# Patient Record
Sex: Female | Born: 1950 | Race: White | Hispanic: No | Smoking: Former smoker
Health system: Southern US, Community
[De-identification: ages and names within clinical notes are randomized; demographics above are authoritative.]

## PROBLEM LIST (undated history)

## (undated) DIAGNOSIS — F32A Depression, unspecified: Secondary | ICD-10-CM

## (undated) DIAGNOSIS — N6009 Solitary cyst of unspecified breast: Secondary | ICD-10-CM

## (undated) DIAGNOSIS — M1990 Unspecified osteoarthritis, unspecified site: Secondary | ICD-10-CM

## (undated) DIAGNOSIS — C541 Malignant neoplasm of endometrium: Secondary | ICD-10-CM

## (undated) DIAGNOSIS — F329 Major depressive disorder, single episode, unspecified: Secondary | ICD-10-CM

## (undated) HISTORY — DX: Solitary cyst of unspecified breast: N60.09

## (undated) HISTORY — DX: Unspecified osteoarthritis, unspecified site: M19.90

## (undated) HISTORY — DX: Malignant neoplasm of endometrium: C54.1

## (undated) HISTORY — DX: Depression, unspecified: F32.A

## (undated) HISTORY — DX: Major depressive disorder, single episode, unspecified: F32.9

---

## 1992-07-06 HISTORY — PX: TOTAL VAGINAL HYSTERECTOMY: SHX2548

## 2001-08-04 ENCOUNTER — Other Ambulatory Visit: Admission: RE | Admit: 2001-08-04 | Discharge: 2001-08-04 | Payer: Self-pay | Admitting: Radiology

## 2006-05-11 ENCOUNTER — Ambulatory Visit: Payer: Self-pay | Admitting: Family Medicine

## 2006-05-12 ENCOUNTER — Encounter: Admission: RE | Admit: 2006-05-12 | Discharge: 2006-05-12 | Payer: Self-pay | Admitting: Family Medicine

## 2006-05-17 ENCOUNTER — Ambulatory Visit: Payer: Self-pay | Admitting: Family Medicine

## 2006-05-17 LAB — CONVERTED CEMR LAB
ALT: 14 units/L (ref 0–40)
Albumin: 3.7 g/dL (ref 3.5–5.2)
Alkaline Phosphatase: 38 units/L — ABNORMAL LOW (ref 39–117)
BUN: 13 mg/dL (ref 6–23)
Basophils Absolute: 0 10*3/uL (ref 0.0–0.1)
Basophils Relative: 0.2 % (ref 0.0–1.0)
CO2: 29 meq/L (ref 19–32)
Calcium: 9 mg/dL (ref 8.4–10.5)
Chloride: 105 meq/L (ref 96–112)
GFR calc non Af Amer: 62 mL/min
Glomerular Filtration Rate, Af Am: 75 mL/min/{1.73_m2}
Glucose, Bld: 96 mg/dL (ref 70–99)
HCT: 41 % (ref 36.0–46.0)
Lymphocytes Relative: 48.6 % — ABNORMAL HIGH (ref 12.0–46.0)
MCV: 86.1 fL (ref 78.0–100.0)
Neutro Abs: 2.9 10*3/uL (ref 1.4–7.7)
Platelets: 298 10*3/uL (ref 150–400)
RBC: 4.76 M/uL (ref 3.87–5.11)
RDW: 12.2 % (ref 11.5–14.6)
Sed Rate: 11 mm/hr (ref 0–25)
T3, Free: 2.9 pg/mL (ref 2.3–4.2)
Total Bilirubin: 0.6 mg/dL (ref 0.3–1.2)
Vitamin B-12: 266 pg/mL (ref 211–911)

## 2006-05-21 ENCOUNTER — Ambulatory Visit: Payer: Self-pay

## 2006-06-04 ENCOUNTER — Ambulatory Visit: Payer: Self-pay | Admitting: Internal Medicine

## 2006-06-18 ENCOUNTER — Ambulatory Visit: Payer: Self-pay | Admitting: Family Medicine

## 2006-06-18 LAB — CONVERTED CEMR LAB
Chol/HDL Ratio, serum: 2.3
Cholesterol: 168 mg/dL
HDL: 71.5 mg/dL
LDL Cholesterol: 75 mg/dL
Triglyceride fasting, serum: 109 mg/dL
VLDL: 22 mg/dL

## 2007-02-04 ENCOUNTER — Encounter: Payer: Self-pay | Admitting: Family Medicine

## 2007-02-15 ENCOUNTER — Encounter: Payer: Self-pay | Admitting: Family Medicine

## 2007-03-29 ENCOUNTER — Ambulatory Visit: Payer: Self-pay | Admitting: Family Medicine

## 2007-03-29 DIAGNOSIS — F329 Major depressive disorder, single episode, unspecified: Secondary | ICD-10-CM

## 2007-03-29 DIAGNOSIS — J069 Acute upper respiratory infection, unspecified: Secondary | ICD-10-CM | POA: Insufficient documentation

## 2007-05-23 ENCOUNTER — Ambulatory Visit: Payer: Self-pay | Admitting: Family Medicine

## 2008-01-03 ENCOUNTER — Ambulatory Visit: Payer: Self-pay | Admitting: Family Medicine

## 2008-03-13 ENCOUNTER — Encounter: Payer: Self-pay | Admitting: Family Medicine

## 2008-03-20 ENCOUNTER — Ambulatory Visit: Payer: Self-pay | Admitting: Family Medicine

## 2008-03-20 DIAGNOSIS — N6009 Solitary cyst of unspecified breast: Secondary | ICD-10-CM

## 2008-03-22 ENCOUNTER — Encounter (INDEPENDENT_AMBULATORY_CARE_PROVIDER_SITE_OTHER): Payer: Self-pay | Admitting: *Deleted

## 2008-03-28 ENCOUNTER — Encounter (INDEPENDENT_AMBULATORY_CARE_PROVIDER_SITE_OTHER): Payer: Self-pay | Admitting: *Deleted

## 2008-03-28 ENCOUNTER — Ambulatory Visit: Payer: Self-pay | Admitting: Family Medicine

## 2008-03-28 LAB — CONVERTED CEMR LAB: OCCULT 3: NEGATIVE

## 2009-03-06 ENCOUNTER — Encounter: Payer: Self-pay | Admitting: Family Medicine

## 2009-03-19 ENCOUNTER — Encounter: Payer: Self-pay | Admitting: Family Medicine

## 2009-08-22 ENCOUNTER — Telehealth: Payer: Self-pay | Admitting: Family Medicine

## 2009-08-23 ENCOUNTER — Ambulatory Visit: Payer: Self-pay | Admitting: Family Medicine

## 2010-01-14 ENCOUNTER — Ambulatory Visit: Payer: Self-pay | Admitting: Family Medicine

## 2010-01-14 DIAGNOSIS — D239 Other benign neoplasm of skin, unspecified: Secondary | ICD-10-CM | POA: Insufficient documentation

## 2010-01-14 DIAGNOSIS — J301 Allergic rhinitis due to pollen: Secondary | ICD-10-CM | POA: Insufficient documentation

## 2010-01-27 ENCOUNTER — Encounter: Payer: Self-pay | Admitting: Family Medicine

## 2010-01-31 ENCOUNTER — Telehealth: Payer: Self-pay | Admitting: Family Medicine

## 2010-02-26 ENCOUNTER — Encounter: Payer: Self-pay | Admitting: Family Medicine

## 2010-04-02 ENCOUNTER — Encounter: Payer: Self-pay | Admitting: Family Medicine

## 2010-08-03 LAB — CONVERTED CEMR LAB
AST: 19 units/L (ref 0–37)
Albumin: 4.3 g/dL (ref 3.5–5.2)
Alkaline Phosphatase: 44 units/L (ref 39–117)
Basophils Relative: 0.4 % (ref 0.0–3.0)
Bilirubin Urine: NEGATIVE
CO2: 27 meq/L (ref 19–32)
Calcium: 9.4 mg/dL (ref 8.4–10.5)
Cholesterol: 175 mg/dL (ref 0–200)
Creatinine, Ser: 0.8 mg/dL (ref 0.4–1.2)
Eosinophils Relative: 0.8 % (ref 0.0–5.0)
GFR calc non Af Amer: 79 mL/min
MCHC: 33.8 g/dL (ref 30.0–36.0)
MCV: 89.9 fL (ref 78.0–100.0)
Monocytes Relative: 7.7 % (ref 3.0–12.0)
Neutro Abs: 3.4 10*3/uL (ref 1.4–7.7)
Neutrophils Relative %: 47.4 % (ref 43.0–77.0)
Nitrite: NEGATIVE
Platelets: 286 10*3/uL (ref 150–400)
Protein, U semiquant: NEGATIVE
RDW: 12.4 % (ref 11.5–14.6)
Sodium: 141 meq/L (ref 135–145)
Total Bilirubin: 0.7 mg/dL (ref 0.3–1.2)
Total CHOL/HDL Ratio: 1.7
Total Protein: 7.8 g/dL (ref 6.0–8.3)
Triglycerides: 138 mg/dL (ref 0–149)
WBC Urine, dipstick: NEGATIVE

## 2010-08-05 NOTE — Progress Notes (Signed)
Summary: Wants Tamiflu  Phone Note Call from Patient Call back at Home Phone [phone]   Caller: Patient Summary of Call: Patient is coming in tomorrow for possible flu. She wanted to know if you could send her some tamiflu bc she knows there is a 48 hour window from being around it and stopping it. If you can call her and let her know what you decide this afternoon. She uses [facility] in [place]. Thanks! Initial call taken by: Harold Barban,  August 22, 2009 3:54 PM  Follow-up for Phone Call        we need her symptoms and how long etc? Follow-up by: Loreen Freud DO,  August 22, 2009 4:45 PM  Additional Follow-up for Phone Call Additional follow up Details #1::        Pt states that her symptoms are- low grade fever of 99.0 , stiff in neck and shoulders, dry cough. Has tried theraflu, and tylenol, started last night. Has been nursing sick people in her house for over a week.     Additional Follow-up for Phone Call Additional follow up Details #2::    per dr Laury Axon okay to call in tamiflu. Army Fossa CMA  August 22, 2009 4:53 PM   New/Updated Medications: TAMIFLU 75 MG CAPS (OSELTAMIVIR PHOSPHATE) 1 by mouth two times a day for 5 days. Prescriptions: TAMIFLU 75 MG CAPS (OSELTAMIVIR PHOSPHATE) 1 by mouth two times a day for 5 days.  #10 x 0   Entered by:   Army Fossa CMA   Authorized by:   Loreen Freud DO   Signed by:   Army Fossa CMA on 08/22/2009   Method used:   Electronically to        [facility] [address] [id number] (retail)       [address] [place], [place]  [address]       Ph: [phone] or [phone]       Fax: [phone]   RxID:   [id number]

## 2010-08-05 NOTE — Assessment & Plan Note (Signed)
Summary: possible flu//lch   Vital Signs:  Patient profile:   60 year old female Height:      67 inches Weight:      146 pounds BMI:     22.95 Temp:     98.2 degrees F oral Pulse rate:   76 / minute Pulse rhythm:   regular BP sitting:   126 / 82  (left arm) Cuff size:   regular  Vitals Entered By: Army Fossa CMA (August 23, 2009 1:58 PM) CC: Pt c/o low grade fever, exposed to flu?, losing voice, cough. , URI symptoms   History of Present Illness:       This is a 60 year old woman who presents with URI symptoms.  The symptoms began 2 weeks ago.  The patient complains of sore throat, dry cough, and sick contacts, but denies nasal congestion, clear nasal discharge, purulent nasal discharge, productive cough, and earache.  The patient denies fever, low-grade fever (<100.5 degrees), fever of 100.5-103 degrees, fever of 103.1-104 degrees, fever to >104 degrees, stiff neck, dyspnea, wheezing, rash, vomiting, diarrhea, use of an antipyretic, and response to antipyretic.  The patient also reports itchy throat.  The patient denies itchy watery eyes, sneezing, seasonal symptoms, response to antihistamine, headache, muscle aches, and severe fatigue.  The patient denies the following risk factors for Strep sinusitis: unilateral facial pain, unilateral nasal discharge, poor response to decongestant, double sickening, tooth pain, Strep exposure, tender adenopathy, and absence of cough.    Current Medications (verified): 1)  Premarin 0.9 Mg  Tabs (Estrogens Conjugated) .Marland Kitchen.. 1 By Mouth Once Daily 2)  Tamiflu 75 Mg Caps (Oseltamivir Phosphate) .Marland Kitchen.. 1 By Mouth Two Times A Day For 5 Days.  Allergies: 1)  ! Pcn  Past History:  Past medical, surgical, family and social histories (including risk factors) reviewed for relevance to current acute and chronic problems.  Past Medical History: Reviewed history from 03/20/2008 and no changes required. Depression Current Problems:  DEPRESSION  (ICD-311) URI (ICD-465.9)  Past Surgical History: Reviewed history from 03/20/2008 and no changes required. 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 Family History of CAD Female --- Muncle,  Mgf Family History Hypertension  Social History: Reviewed history from 03/20/2008 and no changes required. Widow/Widower Former Smoker Drug use-no Regular exercise-yes  Review of Systems      See HPI  Physical Exam  General:  Well-developed,well-nourished,in no acute distress; alert,appropriate and cooperative throughout examination Ears:  External ear exam shows no significant lesions or deformities.  Otoscopic examination reveals clear canals, tympanic membranes are intact bilaterally without bulging, retraction, inflammation or discharge. Hearing is grossly normal bilaterally. Nose:  External nasal examination shows no deformity or inflammation. Nasal mucosa are pink and moist without lesions or exudates. Mouth:  Oral mucosa and oropharynx without lesions or exudates.  Teeth in good repair. Neck:  No deformities, masses, or tenderness noted. Lungs:  Normal respiratory effort, chest expands symmetrically. Lungs are clear to auscultation, no crackles or wheezes. Heart:  Normal rate and regular rhythm. S1 and S2 normal without gallop, murmur, click, rub or other extra sounds. Psych:  Oriented X3 and normally interactive.     Impression & Recommendations:  Problem # 1:  URI (ICD-465.9)  DELSYM NASONEX CLARINEX CALL OR RTO as needed   Instructed on symptomatic treatment. Call if symptoms persist or worsen.   Orders: Flu A+B ([address]109)  Complete Medication List: 1)  Premarin 0.9 Mg Tabs (  Estrogens conjugated) .Marland Kitchen.. 1 by mouth once daily 2)  Tamiflu 75 Mg Caps (Oseltamivir phosphate) .Marland Kitchen.. 1 by mouth two times a day for 5 days.

## 2010-08-05 NOTE — Progress Notes (Signed)
Summary: Request for Epi Pen  Phone Note Call from Patient Call back at Home Phone [phone]   Caller: Patient Summary of Call: Message left on Triage VM: patient would like RX for Epi/Benadryl pen, patient use to have a rx for this but never used it so she stopped requesting it, now patient would like    Shonna Chock CMA  January 31, 2010 2:30 PM   Follow-up for Phone Call        epi pen 0.3 mg IM x1  #1   Follow-up by: Loreen Freud DO,  January 31, 2010 2:38 PM  Additional Follow-up for Phone Call Additional follow up Details #1::        Patient aware. Lucious Groves CMA  January 31, 2010 2:44 PM      New/Updated Medications: EPIPEN 0.3 MG/0.3ML DEVI (EPINEPHRINE) use as directed as needed Prescriptions: EPIPEN 0.3 MG/0.3ML DEVI (EPINEPHRINE) use as directed as needed  #1 x 1   Entered by:   Lucious Groves CMA   Authorized by:   Loreen Freud DO   Signed by:   Lucious Groves CMA on 01/31/2010   Method used:   Faxed to ...       [id number] (retail)       [address]       [place], [place]  [address]       Ph: [phone]       Fax: [phone]   RxID:   [id number]

## 2010-08-05 NOTE — Consult Note (Signed)
Summary: [facility]  [facility]   Imported By: Lanelle Bal 02/20/2010 09:53:46  _____________________________________________________________________  External Attachment:    Type:   Image     Comment:   External Document

## 2010-08-05 NOTE — Assessment & Plan Note (Signed)
Summary: SPOT ON FACE/KN   Vital Signs:  Patient profile:   60 year old female Height:      67 inches Weight:      150 pounds Temp:     98.2 degrees F oral Pulse rate:   72 / minute BP sitting:   130 / 80  (left arm)  Vitals Entered By: Jeremy Johann CMA (January 14, 2010 11:25 AM) CC: spot on face, sore throat, URI symptoms Comments REVIEWED MED LIST, PATIENT AGREED DOSE AND INSTRUCTION CORRECT    History of Present Illness:       This is a 60 year old woman who presents with URI symptoms.  The symptoms began 2 months ago.  The patient complains of sore throat, but denies nasal congestion, clear nasal discharge, purulent nasal discharge, dry cough, productive cough, earache, and sick contacts.  The patient denies fever, low-grade fever (<100.5 degrees), fever of 100.5-103 degrees, fever of 103.1-104 degrees, fever to >104 degrees, stiff neck, dyspnea, wheezing, rash, vomiting, diarrhea, use of an antipyretic, and response to antipyretic.  The patient also reports itchy throat, sneezing, and response to antihistamine.  The patient denies the following risk factors for Strep sinusitis: unilateral facial pain, unilateral nasal discharge, poor response to decongestant, double sickening, tooth pain, Strep exposure, tender adenopathy, and absence of cough.    Current Medications (verified): 1)  Premarin 0.625 Mg Tabs (Estrogens Conjugated) .... Take 1 Tab Once Daily 2)  Claritin 10 Mg Tabs (Loratadine) .Marland Kitchen.. 1 By Mouth Once Daily  Allergies: 1)  ! Pcn  Past History:  Past medical, surgical, family and social histories (including risk factors) reviewed for relevance to current acute and chronic problems.  Past Medical History: Reviewed history from 03/20/2008 and no changes required. Depression Current Problems:  DEPRESSION (ICD-311) URI (ICD-465.9)  Past Surgical History: Reviewed history from 03/20/2008 and no changes required. 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 Family History of CAD Female --- Muncle,  Mgf Family History Hypertension  Social History: Reviewed history from 03/20/2008 and no changes required. Widow/Widower Former Smoker Drug use-no Regular exercise-yes  Review of Systems      See HPI  Physical Exam  General:  Well-developed,well-nourished,in no acute distress; alert,appropriate and cooperative throughout examination Ears:  External ear exam shows no significant lesions or deformities.  Otoscopic examination reveals clear canals, tympanic membranes are intact bilaterally without bulging, retraction, inflammation or discharge. Hearing is grossly normal bilaterally. Nose:  External nasal examination shows no deformity or inflammation. Nasal mucosa are pink and moist without lesions or exudates. Mouth:  pharyngeal erythema and postnasal drip.   Lungs:  Normal respiratory effort, chest expands symmetrically. Lungs are clear to auscultation, no crackles or wheezes. Heart:  normal rate and no murmur.   Skin:  mult sk small black mole R side cheek  Psych:  Cognition and judgment appear intact. Alert and cooperative with normal attention span and concentration. No apparent delusions, illusions, hallucinations   Impression & Recommendations:  Problem # 1:  ALLERGIC RHINITIS, SEASONAL (ICD-477.0)  claritin 10 mg 1 by mouth once daily as needed  call or rto as needed   Orders: Rapid Strep (16109)  Problem # 2:  NEVI, MULTIPLE (ICD-216.9)  Orders: Dermatology Referral (Derma)  Complete Medication List: 1)  Premarin 0.625 Mg Tabs (Estrogens conjugated) .... Take 1 tab once daily 2)  Claritin 10 Mg Tabs (Loratadine) .Marland Kitchen.. 1 by mouth once daily Prescriptions: CLARITIN 10  MG TABS (LORATADINE) 1 by mouth once daily  #90 x 3   Entered and Authorized by:   Loreen Freud DO   Signed by:   Loreen Freud DO on 01/14/2010   Method used:   Print then Give  to Patient   RxID:   [id number]   Laboratory Results    Other Tests  Rapid Strep: negative

## 2010-08-27 ENCOUNTER — Encounter: Payer: Self-pay | Admitting: Internal Medicine

## 2010-08-27 ENCOUNTER — Ambulatory Visit (INDEPENDENT_AMBULATORY_CARE_PROVIDER_SITE_OTHER): Admitting: Internal Medicine

## 2010-08-27 DIAGNOSIS — N390 Urinary tract infection, site not specified: Secondary | ICD-10-CM

## 2010-08-27 LAB — CONVERTED CEMR LAB
Bilirubin Urine: NEGATIVE
Glucose, Urine, Semiquant: NEGATIVE
Protein, U semiquant: NEGATIVE
Urobilinogen, UA: 0.2

## 2010-08-28 ENCOUNTER — Encounter: Payer: Self-pay | Admitting: Internal Medicine

## 2010-08-28 LAB — CONVERTED CEMR LAB: Crystals: NONE SEEN

## 2010-09-01 ENCOUNTER — Telehealth: Payer: Self-pay | Admitting: Internal Medicine

## 2010-09-02 NOTE — Assessment & Plan Note (Signed)
Summary: blood in urine, ? kidney stone//fd   Vital Signs:  Patient profile:   60 year old female Height:      67 inches Weight:      153.25 pounds BMI:     24.09 Temp:     98.0 degrees F oral Pulse rate:   81 / minute Pulse rhythm:   regular BP sitting:   116 / 80  (left arm) Cuff size:   regular  Vitals Entered By: Army Fossa CMA (August 27, 2010 2:27 PM) CC: Possibly passed kidney stone last night  Comments seeing bright red blood, a few "clots" lots of stinging  feels fine today [facility]   History of Present Illness: had gross hematuria yesterday x 3 hours, + dysuria, frecuency and urgency saw few clots in the urine this morning urine looks normal and she feels well   ROS no fever no abd pain no flank pain no N-V  Current Medications (verified): 1)  Premarin 0.625 Mg Tabs (Estrogens Conjugated) .... Take 1 Tab Once Daily 2)  Claritin 10 Mg Tabs (Loratadine) .Marland Kitchen.. 1 By Mouth Once Daily 3)  Epipen 0.3 Mg/0.31ml Devi (Epinephrine) .... Use As Directed As Needed  Allergies (verified): 1)  ! Pcn  Past History:  Past Medical History: Depression    Past Surgical History: Reviewed history from 03/20/2008 and no changes required. 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   Abdomen:  soft, non-tender, no distention, no masses, no guarding, and no rigidity.  no CVA tenderness  Rectal:  no masses.   Extremities:  no pretibial edema bilaterally  Psych:  not anxious appearing and not depressed appearing.     Impression & Recommendations:  Problem # 1:  UTI (ICD-599.0) episode of gross hematuria w/o flank or abd. pain patient concerned about stones but likely she didn't experience a renal colic u dip +, see instructions   Her updated medication list for this problem includes:  Ciprofloxacin Hcl 500 Mg Tabs (Ciprofloxacin hcl) .Marland Kitchen... 1 by mouth two times a day x 5 days  Orders: UA Dipstick w/o Micro (automated)  (81003) T-Culture, Urine ([id number]) T-Urine Microscopic (84166-06301)  Complete Medication List: 1)  Premarin 0.625 Mg Tabs (Estrogens conjugated) .... Take 1 tab once daily 2)  Claritin 10 Mg Tabs (Loratadine) .Marland Kitchen.. 1 by mouth once daily 3)  Epipen 0.3 Mg/0.20ml Devi (Epinephrine) .... Use as directed as needed 4)  Ciprofloxacin Hcl 500 Mg Tabs (Ciprofloxacin hcl) .Marland Kitchen.. 1 by mouth two times a day x 5 days  Patient Instructions: 1)  drink plenty of fluids 2)  cipro x 5 days 3)  call if symptoms resurface  4)  came back in 2 weeks for a UCX and a UA dx UTI Prescriptions: CIPROFLOXACIN HCL 500 MG TABS (CIPROFLOXACIN HCL) 1 by mouth two times a day x 5 days  #10 x 0   Entered and Authorized by:   Elita Quick E. Archer Vise MD   Signed by:   Nolon Rod. Donyae Kilner MD on 08/27/2010   Method used:   Electronically to        [id number] (retail)       [address]       [place], [place]  [address]       Ph: [phone]       Fax: [phone]   RxID:   [id number]    Orders  Added: 1)  UA Dipstick w/o Micro (automated)  [81003] 2)  T-Culture, Urine [[id number]] 3)  T-Urine Microscopic [[id number]] 4)  Est. Patient Level III [30865]    Laboratory Results   Urine Tests    Routine Urinalysis   Color: yellow Appearance: Hazy Glucose: negative   (Normal Range: Negative) Bilirubin: negative   (Normal Range: Negative) Ketone: negative   (Normal Range: Negative) Spec. Gravity: 1.020   (Normal Range: 1.003-1.035) Blood: large   (Normal Range: Negative) pH: 7.0   (Normal Range: 5.0-8.0) Protein: negative   (Normal Range: Negative) Urobilinogen: 0.2   (Normal Range: 0-1) Nitrite: negative   (Normal Range: Negative) Leukocyte Esterace: negative   (Normal Range: Negative)    Comments: Army Fossa CMA  August 27, 2010 3:13 PM

## 2010-09-10 ENCOUNTER — Encounter (INDEPENDENT_AMBULATORY_CARE_PROVIDER_SITE_OTHER): Payer: Self-pay | Admitting: *Deleted

## 2010-09-10 ENCOUNTER — Encounter: Payer: Self-pay | Admitting: Family Medicine

## 2010-09-10 ENCOUNTER — Other Ambulatory Visit (INDEPENDENT_AMBULATORY_CARE_PROVIDER_SITE_OTHER)

## 2010-09-10 DIAGNOSIS — N390 Urinary tract infection, site not specified: Secondary | ICD-10-CM

## 2010-09-10 DIAGNOSIS — R319 Hematuria, unspecified: Secondary | ICD-10-CM

## 2010-09-10 LAB — CONVERTED CEMR LAB
Ketones, urine, test strip: NEGATIVE
Nitrite: NEGATIVE
Specific Gravity, Urine: 1.01
WBC Urine, dipstick: NEGATIVE
pH: 5

## 2010-09-11 ENCOUNTER — Telehealth: Payer: Self-pay | Admitting: Internal Medicine

## 2010-09-11 DIAGNOSIS — Z87448 Personal history of other diseases of urinary system: Secondary | ICD-10-CM | POA: Insufficient documentation

## 2010-09-11 NOTE — Progress Notes (Signed)
Summary: yeast infection after ABX  Phone Note Call from Patient Call back at Home Phone [phone]   Caller: Patient Summary of Call: I spoke w/ pt, she has finished the Cipro. She now has a yeast infection. Please advise if okay to send in rx.   [facility]. Initial call taken by: Army Fossa CMA,  September 01, 2010 9:17 AM  Follow-up for Phone Call         Diflucan 150 one tablet daily for 2 days Follow-up by: [employer] E. Paz MD,  September 01, 2010 12:32 PM    New/Updated Medications: DIFLUCAN 150 MG TABS (FLUCONAZOLE) one tablet daily for 2 days Prescriptions: DIFLUCAN 150 MG TABS (FLUCONAZOLE) one tablet daily for 2 days  #2 x 0   Entered by:   Army Fossa CMA   Authorized by:   Nolon Rod. Paz MD   Signed by:   Army Fossa CMA on 09/01/2010   Method used:   Electronically to        [id number] (retail)       [address]       [place], [place]  [address]       Ph: [phone]       Fax: [phone]   RxID:   [id number]

## 2010-09-12 ENCOUNTER — Ambulatory Visit (INDEPENDENT_AMBULATORY_CARE_PROVIDER_SITE_OTHER)
Admission: RE | Admit: 2010-09-12 | Discharge: 2010-09-12 | Disposition: A | Discharge status: Home / Self Care | Source: Ambulatory Visit | Attending: Internal Medicine | Admitting: Internal Medicine

## 2010-09-12 ENCOUNTER — Other Ambulatory Visit: Payer: Self-pay | Admitting: Internal Medicine

## 2010-09-12 DIAGNOSIS — N200 Calculus of kidney: Secondary | ICD-10-CM

## 2010-09-16 NOTE — Progress Notes (Signed)
Summary: next step/ u/s  Phone Note Call from Patient Call back at Home Phone [phone] [facility]     Caller: Patient Summary of Call: Pt states that she had a repeat culture, UA done on 09-09-09 but does not understand the point of this. Pt note that last culture was done and was negative so "why are we wasting time repeating it". Pt is requesting a U/S to be done to make sure she does not have any stones. Pt denies any abdominal pain, tenderness or burning with urination. Pt has not seen any clots in urine since OV but is concern that she may have stones. Pt wants it to be noted that she is not happy with the process of care. Pls advise....Marland KitchenMarland KitchenFelecia Deloach CMA  September 11, 2010 4:43 PM   Follow-up for Phone Call        Dr Drue Novel is not here so I can not answer the first part of the question  but if she still has pain ---the best test is CT urogram not [place]  Follow-up by: Loreen Freud DO,  September 11, 2010 5:03 PM  Additional Follow-up for Phone Call Additional follow up Details #1::        pt aware of the above and aware Renee will call with appt info Additional Follow-up by: Almeta Monas CMA Duncan Dull),  September 11, 2010 5:22 PM  New Problems: HEMATURIA, HX OF (ICD-V13.09)   New Problems: HEMATURIA, HX OF (ICD-V13.09)

## 2011-04-24 ENCOUNTER — Ambulatory Visit (INDEPENDENT_AMBULATORY_CARE_PROVIDER_SITE_OTHER): Admitting: Family Medicine

## 2011-04-24 ENCOUNTER — Encounter: Payer: Self-pay | Admitting: Family Medicine

## 2011-04-24 VITALS — BP 115/62 | HR 64 | Temp 98.1°F | Ht 67.0 in | Wt 155.0 lb

## 2011-04-24 DIAGNOSIS — T6391XA Toxic effect of contact with unspecified venomous animal, accidental (unintentional), initial encounter: Secondary | ICD-10-CM

## 2011-04-24 DIAGNOSIS — T63441A Toxic effect of venom of bees, accidental (unintentional), initial encounter: Secondary | ICD-10-CM

## 2011-04-24 MED ORDER — METHYLPREDNISOLONE ACETATE 80 MG/ML IJ SUSP
80.0000 mg | Freq: Once | INTRAMUSCULAR | Status: AC
  Administered 2011-04-24: 80 mg via INTRAMUSCULAR

## 2011-04-24 MED ORDER — PREDNISONE 20 MG PO TABS: ORAL_TABLET | ORAL | Status: DC

## 2011-04-24 MED ORDER — DIPHENHYDRAMINE HCL 25 MG PO TABS: 25.0000 mg | ORAL_TABLET | Freq: Four times a day (QID) | ORAL | Status: AC | PRN

## 2011-04-24 NOTE — Patient Instructions (Signed)
Start the prednisone tomorrow morning- take w/ food to avoid upset stomach Benadryl as needed for itching or sleep Call with any questions or concerns Hang in there!

## 2011-04-24 NOTE — Progress Notes (Signed)
  Subjective:    Patient ID: Patricia Bennett, female    DOB: Jan 13, 1951, 60 y.o.   MRN: [id number]  HPI Bee sting- occurred on Sunday.  L upper inner arm.  Using epsom salts, hydrocortisone, benadryl.  Redness is traveling into armpit and onto trunk.  Skin feels tight, painful LNs.  Has hx of 'bad reactions' to stings.  Denies swelling of mouth, lips, tongue, or difficulty swallowing.   Review of Systems For ROS see HPI     Objective:   Physical Exam  Vitals reviewed. Constitutional: She appears well-developed and well-nourished. No distress.  HENT:  Mouth/Throat: Oropharynx is clear and moist. No oropharyngeal exudate.  Skin: Skin is warm. There is erythema (3 cm oval area on L upper inner arm, multiple areas on upper torso and back- consistent w/ hives).          Assessment & Plan:

## 2011-04-26 NOTE — Assessment & Plan Note (Signed)
Pt w/ hives that are spreading from site of initial sting.  Given hx of 'severe rxns' to previous stings will give depo-medrol injxn and start 40mg  prednisone.  Reviewed supportive care and red flags that should prompt return.  Pt expressed understanding and is in agreement w/ plan.

## 2011-10-30 IMAGING — CT CT ABD-PELV W/O CM
2 of 4 series · 17 of 46 positions shown, 19 images · non-contrast
Comparison: None

CLINICAL DATA: Flank pain and hematuria.

CT ABDOMEN AND PELVIS WITHOUT CONTRAST
TECHNIQUE: Multidetector CT imaging of the abdomen and pelvis was
performed following the standard protocol without intravenous
contrast.

[Series 2: ap stone study · axial · 0.71mm/px · z∈[-492,-82]mm · 14 of 90 slices shown, 16 images]
[im 4/90  soft-tissue]
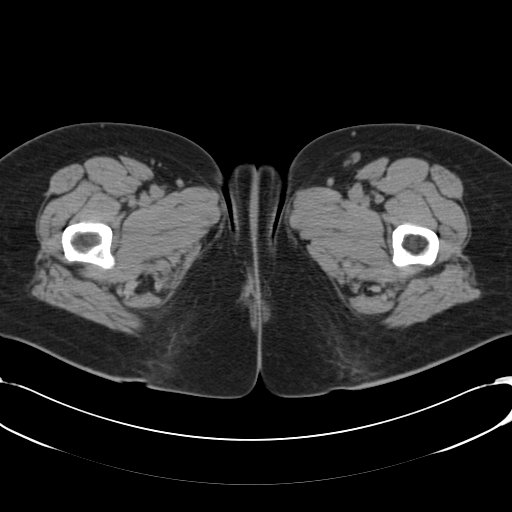
[im 4/90  bone]
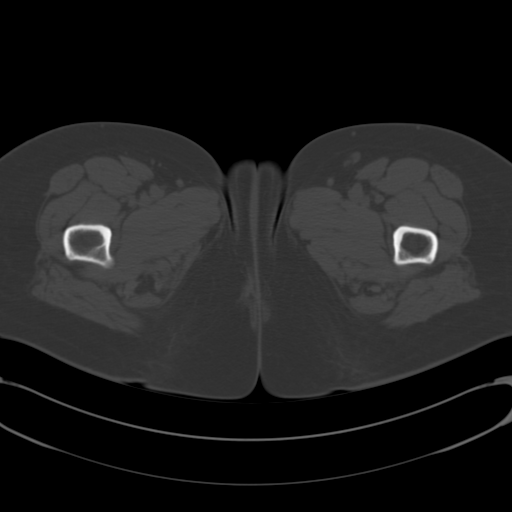
[im 11/90  soft-tissue]
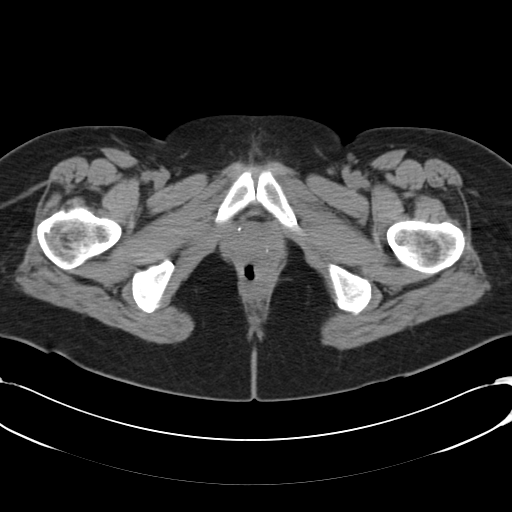
[im 18/90  soft-tissue]
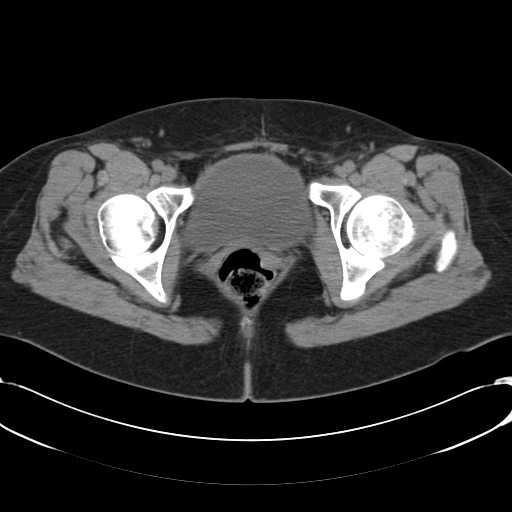
[im 25/90  soft-tissue]
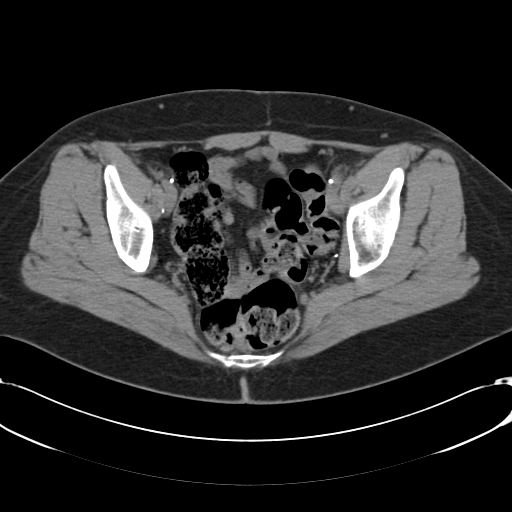
[im 29/90  soft-tissue]
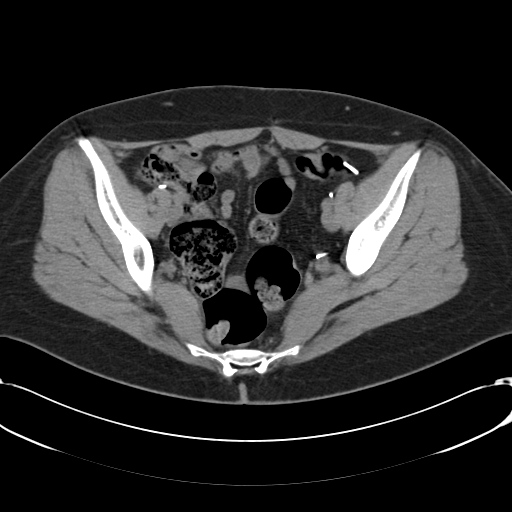
[im 36/90  soft-tissue]
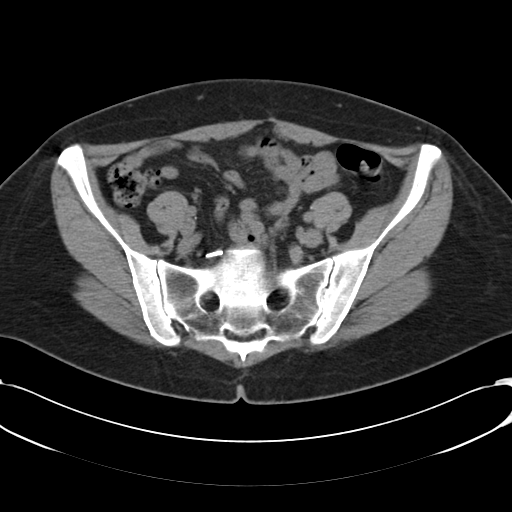
[im 43/90  soft-tissue]
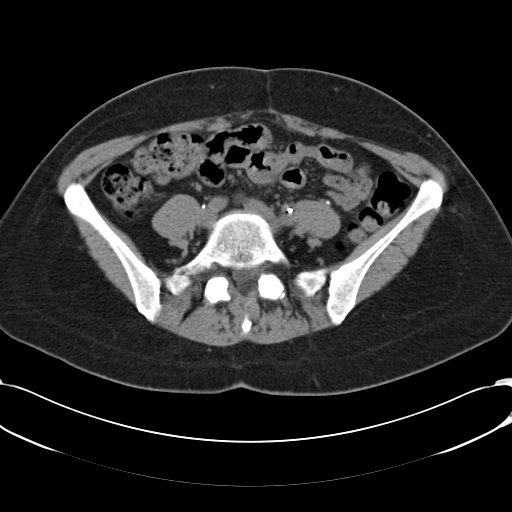
[im 47/90  soft-tissue]
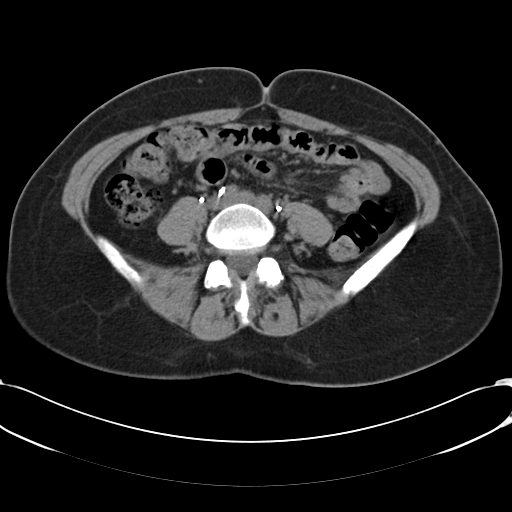
[im 54/90  soft-tissue]
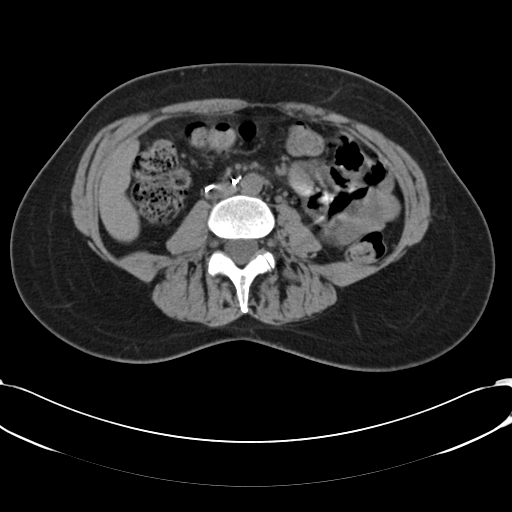
[im 54/90  bone]
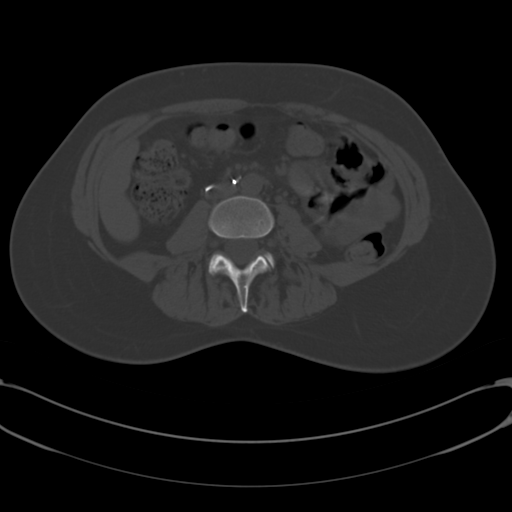
[im 61/90  soft-tissue]
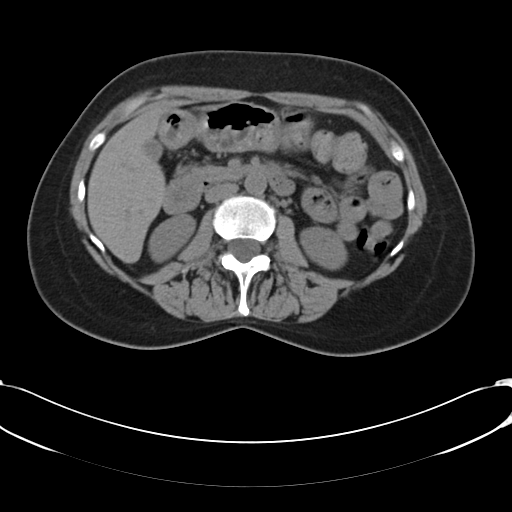
[im 68/90  soft-tissue]
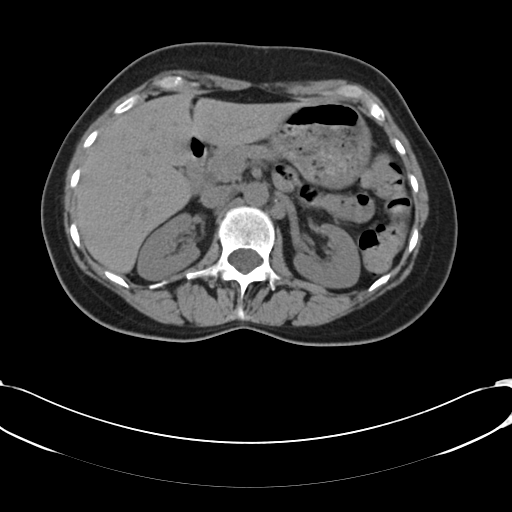
[im 72/90  soft-tissue]
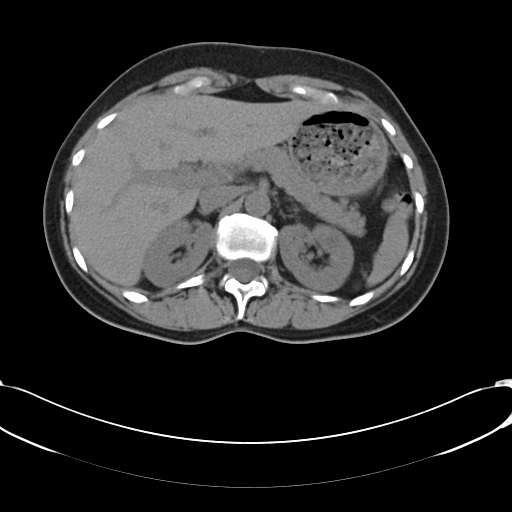
[im 79/90  soft-tissue]
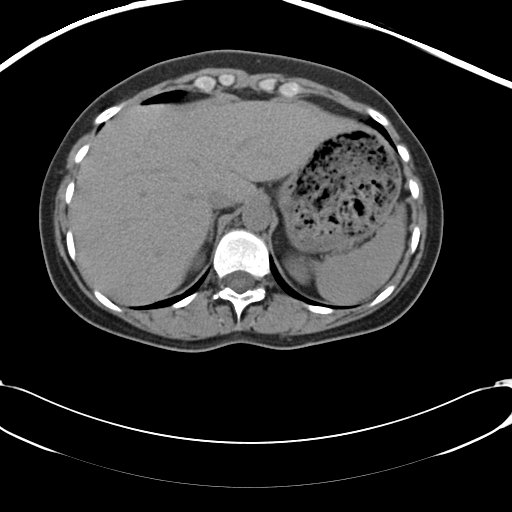
[im 86/90  soft-tissue]
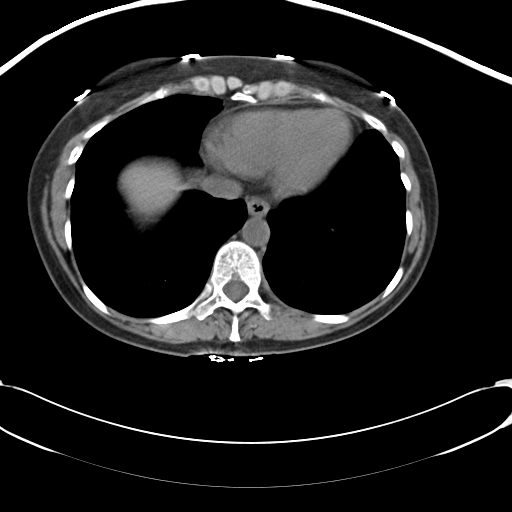

[Series 602: <mpr range> · coronal · 0.91mm/px · 3 of 111 slices shown]
[im 37/111  soft-tissue]
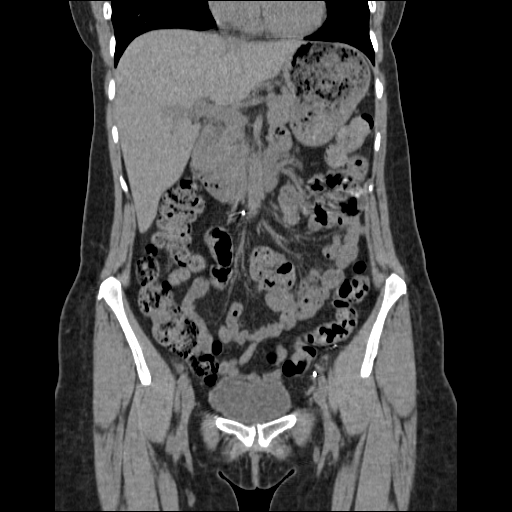
[im 49/111  soft-tissue]
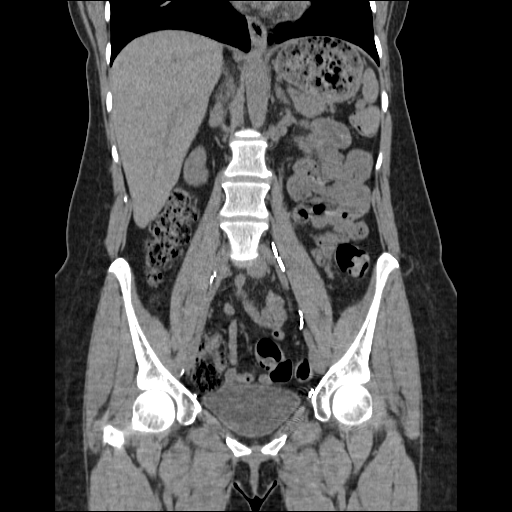
[im 62/111  soft-tissue]
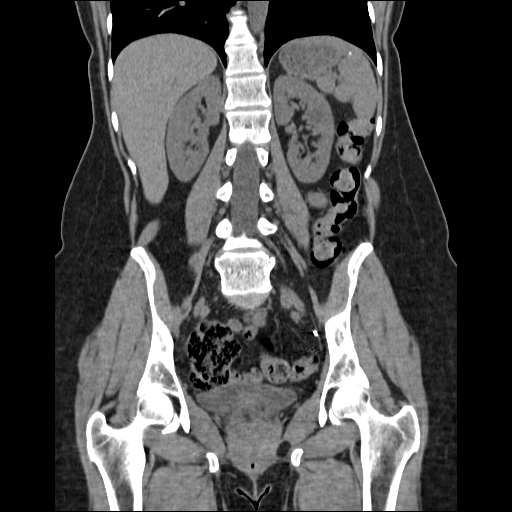

[17 of 46 positions shown; findings below may reference images not displayed]

FINDINGS: The liver, adrenal glands, gallbladder, pancreas and
spleen are unremarkable except for calcified splenic granulomas.

The kidneys bilaterally are unremarkable except for a probable left
renal cyst.  There is no evidence of hydronephrosis or urinary
calculi.

Please note that parenchymal abnormalities may be missed as
intravenous contrast was not administered.
No free fluid, enlarged lymph nodes, biliary dilation or abdominal
aortic aneurysm identified.
Surgical clips within the retroperitoneum and pelvis are present.
The bowel is unremarkable.

The bladder is within normal limits.

The patient is status post hysterectomy.
No acute or suspicious bony abnormalities are present.
IMPRESSION: No acute abnormalities.

No findings to suggest a cause for this patient's hematuria on this
noncontrast CT.  Consider further evaluation or urologic
consultation.

Hysterectomy and prior abdominal/pelvic surgery.

## 2012-06-06 ENCOUNTER — Encounter: Payer: Self-pay | Admitting: Family Medicine

## 2012-06-24 ENCOUNTER — Encounter: Payer: Self-pay | Admitting: Family Medicine

## 2012-08-30 ENCOUNTER — Encounter: Payer: Self-pay | Admitting: Family Medicine

## 2012-08-30 ENCOUNTER — Other Ambulatory Visit (HOSPITAL_COMMUNITY)
Admission: RE | Admit: 2012-08-30 | Discharge: 2012-08-30 | Disposition: A | Discharge status: Home / Self Care | Source: Ambulatory Visit | Attending: Family Medicine | Admitting: Family Medicine

## 2012-08-30 ENCOUNTER — Ambulatory Visit (INDEPENDENT_AMBULATORY_CARE_PROVIDER_SITE_OTHER): Admitting: Family Medicine

## 2012-08-30 VITALS — BP 110/74 | HR 60 | Temp 97.6°F | Ht 66.75 in | Wt 153.8 lb

## 2012-08-30 DIAGNOSIS — Z2911 Encounter for prophylactic immunotherapy for respiratory syncytial virus (RSV): Secondary | ICD-10-CM

## 2012-08-30 DIAGNOSIS — R319 Hematuria, unspecified: Secondary | ICD-10-CM

## 2012-08-30 DIAGNOSIS — Z780 Asymptomatic menopausal state: Secondary | ICD-10-CM

## 2012-08-30 DIAGNOSIS — Z01419 Encounter for gynecological examination (general) (routine) without abnormal findings: Secondary | ICD-10-CM | POA: Insufficient documentation

## 2012-08-30 DIAGNOSIS — Z0000 Encounter for general adult medical examination without abnormal findings: Secondary | ICD-10-CM

## 2012-08-30 LAB — CBC WITH DIFFERENTIAL/PLATELET
Basophils Absolute: 0 10*3/uL (ref 0.0–0.1)
Eosinophils Absolute: 0.1 10*3/uL (ref 0.0–0.7)
HCT: 41.5 % (ref 36.0–46.0)
Lymphocytes Relative: 50.9 % — ABNORMAL HIGH (ref 12.0–46.0)
Lymphs Abs: 3.7 10*3/uL (ref 0.7–4.0)
MCV: 87.1 fl (ref 78.0–100.0)
Neutro Abs: 2.8 10*3/uL (ref 1.4–7.7)
RBC: 4.76 Mil/uL (ref 3.87–5.11)
WBC: 7.3 10*3/uL (ref 4.5–10.5)

## 2012-08-30 LAB — POCT URINALYSIS DIPSTICK
Ketones, UA: NEGATIVE
Leukocytes, UA: NEGATIVE
Nitrite, UA: NEGATIVE
Urobilinogen, UA: 0.2

## 2012-08-30 LAB — HEPATIC FUNCTION PANEL
ALT: 17 U/L (ref 0–35)
Total Bilirubin: 0.6 mg/dL (ref 0.3–1.2)

## 2012-08-30 LAB — LIPID PANEL
HDL: 71.5 mg/dL (ref >39.00)
Total CHOL/HDL Ratio: 2
Triglycerides: 112 mg/dL (ref 0.0–149.0)

## 2012-08-30 LAB — BASIC METABOLIC PANEL
CO2: 26 mEq/L (ref 19–32)
Calcium: 9.3 mg/dL (ref 8.4–10.5)
GFR: 67.86 mL/min (ref >60.00)

## 2012-08-30 LAB — MICROALBUMIN / CREATININE URINE RATIO: Creatinine,U: 62 mg/dL

## 2012-08-30 NOTE — Progress Notes (Signed)
Subjective:     Patricia Bennett is a 62 y.o. female and is here for a comprehensive physical exam. The patient reports no problems.  History   Social History  . Marital Status: Widowed    Spouse Name: N/A    Number of Children: N/A  . Years of Education: N/A   Occupational History  . Not on file.   Social History Main Topics  . Smoking status: Former Smoker -- 3.00 packs/day for 30 years    Quit date: 03/30/1989  . Smokeless tobacco: Not on file  . Alcohol Use: Yes     Comment: ocassionally  . Drug Use: No  . Sexually Active: Yes -- Female partner(s)   Other Topics Concern  . Not on file   Social History Narrative  . No narrative on file   Health Maintenance  Topic Date Due  . Tetanus/tdap  07/28/1971  . Zostavax  07/27/2012  . Influenza Vaccine  03/06/2013  . Colonoscopy  03/19/2014  . Mammogram  05/10/2014  . Pap Smear  08/31/2015    The following portions of the patient's history were reviewed and updated as appropriate:  She  has a past medical history of Depression; Breast cyst; Endometrial stromal sarcoma; and Arthritis. She  does not have any pertinent problems on file. She  has past surgical history that includes Total vaginal hysterectomy (1999). Her family history includes CAD in her maternal grandfather and maternal uncle; Hypertension in an unspecified family member; and Leukemia in her mother. She  reports that she quit smoking about 23 years ago. She does not have any smokeless tobacco history on file. She reports that  drinks alcohol. She reports that she does not use illicit drugs. She has a current medication list which includes the following prescription(s): epinephrine and estrogens (conjugated). No current outpatient prescriptions on file prior to visit.   No current facility-administered medications on file prior to visit.   She is allergic to penicillins..  Review of Systems Review of Systems  Constitutional: Negative for activity change,  appetite change and fatigue.  HENT: Negative for hearing loss, congestion, tinnitus and ear discharge.  dentist q22m Eyes: Negative for visual disturbance (see optho q2y -- vision corrected to 20/20 with glasses).  Respiratory: Negative for cough, chest tightness and shortness of breath.   Cardiovascular: Negative for chest pain, palpitations and leg swelling.  Gastrointestinal: Negative for abdominal pain, diarrhea, constipation and abdominal distention.  Genitourinary: Negative for urgency, frequency, decreased urine volume and difficulty urinating.  Musculoskeletal: Negative for back pain, arthralgias and gait problem.  Skin: Negative for color change, pallor and rash.  Neurological: Negative for dizziness, light-headedness, numbness and headaches.  Hematological: Negative for adenopathy. Does not bruise/bleed easily.  Psychiatric/Behavioral: Negative for suicidal ideas, confusion, sleep disturbance, self-injury, dysphoric mood, decreased concentration and agitation.       Objective:    BP 110/74  Pulse 60  Temp(Src) 97.6 F (36.4 C) (Oral)  Ht 5' 6.75" (1.695 m)  Wt 153 lb 12.8 oz (69.763 kg)  BMI 24.28 kg/m2  SpO2 97% General appearance: alert, cooperative, appears stated age and no distress Head: Normocephalic, without obvious abnormality, atraumatic Eyes: conjunctivae/corneas clear. PERRL, EOM's intact. Fundi benign. Ears: normal TM's and external ear canals both ears Nose: Nares normal. Septum midline. Mucosa normal. No drainage or sinus tenderness. Throat: lips, mucosa, and tongue normal; teeth and gums normal Neck: no adenopathy, supple, symmetrical, trachea midline and thyroid not enlarged, symmetric, no tenderness/mass/nodules Back: symmetric, no curvature. ROM normal.  No CVA tenderness. Lungs: clear to auscultation bilaterally Breasts: normal appearance, no masses or tenderness Heart: S1, S2 normal Abdomen: soft, non-tender; bowel sounds normal; no masses,  no  organomegaly Pelvic: external genitalia normal, no adnexal masses or tenderness, rectovaginal septum normal, uterus surgically absent and vagina normal without discharge, vaginal smear done Extremities: extremities normal, atraumatic, no cyanosis or edema Pulses: 2+ and symmetric Skin: Skin color, texture, turgor normal. No rashes or lesions Lymph nodes: Cervical, supraclavicular, and axillary nodes normal. Neurologic: Alert and oriented X 3, normal strength and tone. Normal symmetric reflexes. Normal coordination and gait Psych-- no depression, no anxiety      Assessment:    Healthy female exam.      Plan:  ghm utd Check labs    See After Visit Summary for Counseling Recommendations

## 2012-08-30 NOTE — Patient Instructions (Addendum)
Preventive Care for Adults, Female A healthy lifestyle and preventive care can promote health and wellness. Preventive health guidelines for women include the following key practices.  A routine yearly physical is a good way to check with your caregiver about your health and preventive screening. It is a chance to share any concerns and updates on your health, and to receive a thorough exam.  Visit your dentist for a routine exam and preventive care every 6 months. Brush your teeth twice a day and floss once a day. Good oral hygiene prevents tooth decay and gum disease.  The frequency of eye exams is based on your age, health, family medical history, use of contact lenses, and other factors. Follow your caregiver's recommendations for frequency of eye exams.  Eat a healthy diet. Foods like vegetables, fruits, whole grains, low-fat dairy products, and lean protein foods contain the nutrients you need without too many calories. Decrease your intake of foods high in solid fats, added sugars, and salt. Eat the right amount of calories for you.Get information about a proper diet from your caregiver, if necessary.  Regular physical exercise is one of the most important things you can do for your health. Most adults should get at least 150 minutes of moderate-intensity exercise (any activity that increases your heart rate and causes you to sweat) each week. In addition, most adults need muscle-strengthening exercises on 2 or more days a week.  Maintain a healthy weight. The body mass index (BMI) is a screening tool to identify possible weight problems. It provides an estimate of body fat based on height and weight. Your caregiver can help determine your BMI, and can help you achieve or maintain a healthy weight.For adults 20 years and older:  A BMI below 18.5 is considered underweight.  A BMI of 18.5 to 24.9 is normal.  A BMI of 25 to 29.9 is considered overweight.  A BMI of 30 and above is  considered obese.  Maintain normal blood lipids and cholesterol levels by exercising and minimizing your intake of saturated fat. Eat a balanced diet with plenty of fruit and vegetables. Blood tests for lipids and cholesterol should begin at age 20 and be repeated every 5 years. If your lipid or cholesterol levels are high, you are over 50, or you are at high risk for heart disease, you may need your cholesterol levels checked more frequently.Ongoing high lipid and cholesterol levels should be treated with medicines if diet and exercise are not effective.  If you smoke, find out from your caregiver how to quit. If you do not use tobacco, do not start.  If you are pregnant, do not drink alcohol. If you are breastfeeding, be very cautious about drinking alcohol. If you are not pregnant and choose to drink alcohol, do not exceed 1 drink per day. One drink is considered to be 12 ounces (355 mL) of beer, 5 ounces (148 mL) of wine, or 1.5 ounces (44 mL) of liquor.  Avoid use of street drugs. Do not share needles with anyone. Ask for help if you need support or instructions about stopping the use of drugs.  High blood pressure causes heart disease and increases the risk of stroke. Your blood pressure should be checked at least every 1 to 2 years. Ongoing high blood pressure should be treated with medicines if weight loss and exercise are not effective.  If you are 55 to 60 years old, ask your caregiver if you should take aspirin to prevent strokes.  Diabetes   screening involves taking a blood sample to check your fasting blood sugar level. This should be done once every 3 years, after age 45, if you are within normal weight and without risk factors for diabetes. Testing should be considered at a younger age or be carried out more frequently if you are overweight and have at least 1 risk factor for diabetes.  Breast cancer screening is essential preventive care for women. You should practice "breast  self-awareness." This means understanding the normal appearance and feel of your breasts and may include breast self-examination. Any changes detected, no matter how small, should be reported to a caregiver. Women in their 20s and 30s should have a clinical breast exam (CBE) by a caregiver as part of a regular health exam every 1 to 3 years. After age 40, women should have a CBE every year. Starting at age 40, women should consider having a mammography (breast X-ray test) every year. Women who have a family history of breast cancer should talk to their caregiver about genetic screening. Women at a high risk of breast cancer should talk to their caregivers about having magnetic resonance imaging (MRI) and a mammography every year.  The Pap test is a screening test for cervical cancer. A Pap test can show cell changes on the cervix that might become cervical cancer if left untreated. A Pap test is a procedure in which cells are obtained and examined from the lower end of the uterus (cervix).  Women should have a Pap test starting at age 21.  Between ages 21 and 29, Pap tests should be repeated every 2 years.  Beginning at age 30, you should have a Pap test every 3 years as long as the past 3 Pap tests have been normal.  Some women have medical problems that increase the chance of getting cervical cancer. Talk to your caregiver about these problems. It is especially important to talk to your caregiver if a new problem develops soon after your last Pap test. In these cases, your caregiver may recommend more frequent screening and Pap tests.  The above recommendations are the same for women who have or have not gotten the vaccine for human papillomavirus (HPV).  If you had a hysterectomy for a problem that was not cancer or a condition that could lead to cancer, then you no longer need Pap tests. Even if you no longer need a Pap test, a regular exam is a good idea to make sure no other problems are  starting.  If you are between ages 65 and 70, and you have had normal Pap tests going back 10 years, you no longer need Pap tests. Even if you no longer need a Pap test, a regular exam is a good idea to make sure no other problems are starting.  If you have had past treatment for cervical cancer or a condition that could lead to cancer, you need Pap tests and screening for cancer for at least 20 years after your treatment.  If Pap tests have been discontinued, risk factors (such as a new sexual partner) need to be reassessed to determine if screening should be resumed.  The HPV test is an additional test that may be used for cervical cancer screening. The HPV test looks for the virus that can cause the cell changes on the cervix. The cells collected during the Pap test can be tested for HPV. The HPV test could be used to screen women aged 30 years and older, and should   be used in women of any age who have unclear Pap test results. After the age of 30, women should have HPV testing at the same frequency as a Pap test.  Colorectal cancer can be detected and often prevented. Most routine colorectal cancer screening begins at the age of 50 and continues through age 75. However, your caregiver may recommend screening at an earlier age if you have risk factors for colon cancer. On a yearly basis, your caregiver may provide home test kits to check for hidden blood in the stool. Use of a small camera at the end of a tube, to directly examine the colon (sigmoidoscopy or colonoscopy), can detect the earliest forms of colorectal cancer. Talk to your caregiver about this at age 50, when routine screening begins. Direct examination of the colon should be repeated every 5 to 10 years through age 75, unless early forms of pre-cancerous polyps or small growths are found.  Hepatitis C blood testing is recommended for all people born from 1945 through 1965 and any individual with known risks for hepatitis C.  Practice  safe sex. Use condoms and avoid high-risk sexual practices to reduce the spread of sexually transmitted infections (STIs). STIs include gonorrhea, chlamydia, syphilis, trichomonas, herpes, HPV, and human immunodeficiency virus (HIV). Herpes, HIV, and HPV are viral illnesses that have no cure. They can result in disability, cancer, and death. Sexually active women aged 25 and younger should be checked for chlamydia. Older women with new or multiple partners should also be tested for chlamydia. Testing for other STIs is recommended if you are sexually active and at increased risk.  Osteoporosis is a disease in which the bones lose minerals and strength with aging. This can result in serious bone fractures. The risk of osteoporosis can be identified using a bone density scan. Women ages 65 and over and women at risk for fractures or osteoporosis should discuss screening with their caregivers. Ask your caregiver whether you should take a calcium supplement or vitamin D to reduce the rate of osteoporosis.  Menopause can be associated with physical symptoms and risks. Hormone replacement therapy is available to decrease symptoms and risks. You should talk to your caregiver about whether hormone replacement therapy is right for you.  Use sunscreen with sun protection factor (SPF) of 30 or more. Apply sunscreen liberally and repeatedly throughout the day. You should seek shade when your shadow is shorter than you. Protect yourself by wearing long sleeves, pants, a wide-brimmed hat, and sunglasses year round, whenever you are outdoors.  Once a month, do a whole body skin exam, using a mirror to look at the skin on your back. Notify your caregiver of new moles, moles that have irregular borders, moles that are larger than a pencil eraser, or moles that have changed in shape or color.  Stay current with required immunizations.  Influenza. You need a dose every fall (or winter). The composition of the flu vaccine  changes each year, so being vaccinated once is not enough.  Pneumococcal polysaccharide. You need 1 to 2 doses if you smoke cigarettes or if you have certain chronic medical conditions. You need 1 dose at age 65 (or older) if you have never been vaccinated.  Tetanus, diphtheria, pertussis (Tdap, Td). Get 1 dose of Tdap vaccine if you are younger than age 65, are over 65 and have contact with an infant, are a healthcare worker, are pregnant, or simply want to be protected from whooping cough. After that, you need a Td   booster dose every 10 years. Consult your caregiver if you have not had at least 3 tetanus and diphtheria-containing shots sometime in your life or have a deep or dirty wound.  HPV. You need this vaccine if you are a woman age 26 or younger. The vaccine is given in 3 doses over 6 months.  Measles, mumps, rubella (MMR). You need at least 1 dose of MMR if you were born in 1957 or later. You may also need a second dose.  Meningococcal. If you are age 19 to 21 and a first-year college student living in a residence hall, or have one of several medical conditions, you need to get vaccinated against meningococcal disease. You may also need additional booster doses.  Zoster (shingles). If you are age 60 or older, you should get this vaccine.  Varicella (chickenpox). If you have never had chickenpox or you were vaccinated but received only 1 dose, talk to your caregiver to find out if you need this vaccine.  Hepatitis A. You need this vaccine if you have a specific risk factor for hepatitis A virus infection or you simply wish to be protected from this disease. The vaccine is usually given as 2 doses, 6 to 18 months apart.  Hepatitis B. You need this vaccine if you have a specific risk factor for hepatitis B virus infection or you simply wish to be protected from this disease. The vaccine is given in 3 doses, usually over 6 months. Preventive Services / Frequency Ages 19 to 39  Blood  pressure check.** / Every 1 to 2 years.  Lipid and cholesterol check.** / Every 5 years beginning at age 20.  Clinical breast exam.** / Every 3 years for women in their 20s and 30s.  Pap test.** / Every 2 years from ages 21 through 29. Every 3 years starting at age 30 through age 65 or 70 with a history of 3 consecutive normal Pap tests.  HPV screening.** / Every 3 years from ages 30 through ages 65 to 70 with a history of 3 consecutive normal Pap tests.  Hepatitis C blood test.** / For any individual with known risks for hepatitis C.  Skin self-exam. / Monthly.  Influenza immunization.** / Every year.  Pneumococcal polysaccharide immunization.** / 1 to 2 doses if you smoke cigarettes or if you have certain chronic medical conditions.  Tetanus, diphtheria, pertussis (Tdap, Td) immunization. / A one-time dose of Tdap vaccine. After that, you need a Td booster dose every 10 years.  HPV immunization. / 3 doses over 6 months, if you are 26 and younger.  Measles, mumps, rubella (MMR) immunization. / You need at least 1 dose of MMR if you were born in 1957 or later. You may also need a second dose.  Meningococcal immunization. / 1 dose if you are age 19 to 21 and a first-year college student living in a residence hall, or have one of several medical conditions, you need to get vaccinated against meningococcal disease. You may also need additional booster doses.  Varicella immunization.** / Consult your caregiver.  Hepatitis A immunization.** / Consult your caregiver. 2 doses, 6 to 18 months apart.  Hepatitis B immunization.** / Consult your caregiver. 3 doses usually over 6 months. Ages 40 to 64  Blood pressure check.** / Every 1 to 2 years.  Lipid and cholesterol check.** / Every 5 years beginning at age 20.  Clinical breast exam.** / Every year after age 40.  Mammogram.** / Every year beginning at age 40   and continuing for as long as you are in good health. Consult with your  caregiver.  Pap test.** / Every 3 years starting at age 30 through age 65 or 70 with a history of 3 consecutive normal Pap tests.  HPV screening.** / Every 3 years from ages 30 through ages 65 to 70 with a history of 3 consecutive normal Pap tests.  Fecal occult blood test (FOBT) of stool. / Every year beginning at age 50 and continuing until age 75. You may not need to do this test if you get a colonoscopy every 10 years.  Flexible sigmoidoscopy or colonoscopy.** / Every 5 years for a flexible sigmoidoscopy or every 10 years for a colonoscopy beginning at age 50 and continuing until age 75.  Hepatitis C blood test.** / For all people born from 1945 through 1965 and any individual with known risks for hepatitis C.  Skin self-exam. / Monthly.  Influenza immunization.** / Every year.  Pneumococcal polysaccharide immunization.** / 1 to 2 doses if you smoke cigarettes or if you have certain chronic medical conditions.  Tetanus, diphtheria, pertussis (Tdap, Td) immunization.** / A one-time dose of Tdap vaccine. After that, you need a Td booster dose every 10 years.  Measles, mumps, rubella (MMR) immunization. / You need at least 1 dose of MMR if you were born in 1957 or later. You may also need a second dose.  Varicella immunization.** / Consult your caregiver.  Meningococcal immunization.** / Consult your caregiver.  Hepatitis A immunization.** / Consult your caregiver. 2 doses, 6 to 18 months apart.  Hepatitis B immunization.** / Consult your caregiver. 3 doses, usually over 6 months. Ages 65 and over  Blood pressure check.** / Every 1 to 2 years.  Lipid and cholesterol check.** / Every 5 years beginning at age 20.  Clinical breast exam.** / Every year after age 40.  Mammogram.** / Every year beginning at age 40 and continuing for as long as you are in good health. Consult with your caregiver.  Pap test.** / Every 3 years starting at age 30 through age 65 or 70 with a 3  consecutive normal Pap tests. Testing can be stopped between 65 and 70 with 3 consecutive normal Pap tests and no abnormal Pap or HPV tests in the past 10 years.  HPV screening.** / Every 3 years from ages 30 through ages 65 or 70 with a history of 3 consecutive normal Pap tests. Testing can be stopped between 65 and 70 with 3 consecutive normal Pap tests and no abnormal Pap or HPV tests in the past 10 years.  Fecal occult blood test (FOBT) of stool. / Every year beginning at age 50 and continuing until age 75. You may not need to do this test if you get a colonoscopy every 10 years.  Flexible sigmoidoscopy or colonoscopy.** / Every 5 years for a flexible sigmoidoscopy or every 10 years for a colonoscopy beginning at age 50 and continuing until age 75.  Hepatitis C blood test.** / For all people born from 1945 through 1965 and any individual with known risks for hepatitis C.  Osteoporosis screening.** / A one-time screening for women ages 65 and over and women at risk for fractures or osteoporosis.  Skin self-exam. / Monthly.  Influenza immunization.** / Every year.  Pneumococcal polysaccharide immunization.** / 1 dose at age 65 (or older) if you have never been vaccinated.  Tetanus, diphtheria, pertussis (Tdap, Td) immunization. / A one-time dose of Tdap vaccine if you are over   65 and have contact with an infant, are a healthcare worker, or simply want to be protected from whooping cough. After that, you need a Td booster dose every 10 years.  Varicella immunization.** / Consult your caregiver.  Meningococcal immunization.** / Consult your caregiver.  Hepatitis A immunization.** / Consult your caregiver. 2 doses, 6 to 18 months apart.  Hepatitis B immunization.** / Check with your caregiver. 3 doses, usually over 6 months. ** Family history and personal history of risk and conditions may change your caregiver's recommendations. Document Released: 08/18/2001 Document Revised: 09/14/2011  Document Reviewed: 11/17/2010 [employer] Patient Information 2013 [employer]  

## 2012-08-31 LAB — URINE CULTURE: Colony Count: 4000

## 2012-09-11 ENCOUNTER — Encounter: Payer: Self-pay | Admitting: Family Medicine

## 2012-09-12 MED ORDER — ESTROGENS CONJUGATED 0.625 MG PO TABS: 0.6250 mg | ORAL_TABLET | Freq: Every day | ORAL | Status: AC

## 2012-09-12 MED ORDER — EPINEPHRINE 0.3 MG/0.3ML IJ DEVI: 0.3000 mg | Freq: Once | INTRAMUSCULAR | Status: AC

## 2012-11-17 ENCOUNTER — Encounter: Payer: Self-pay | Admitting: Family Medicine

## 2012-11-18 ENCOUNTER — Encounter: Payer: Self-pay | Admitting: Family Medicine

## 2012-11-24 ENCOUNTER — Encounter: Payer: Self-pay | Admitting: Family Medicine

## 2012-12-04 ENCOUNTER — Encounter: Payer: Self-pay | Admitting: Family Medicine

## 2012-12-05 ENCOUNTER — Encounter: Payer: Self-pay | Admitting: Family Medicine

## 2012-12-14 ENCOUNTER — Encounter: Payer: Self-pay | Admitting: Family Medicine

## 2013-05-09 LAB — HM MAMMOGRAPHY: HM Mammogram: NORMAL

## 2013-05-11 ENCOUNTER — Other Ambulatory Visit: Payer: Self-pay

## 2013-05-18 ENCOUNTER — Encounter: Payer: Self-pay | Admitting: Family Medicine

## 2013-09-01 ENCOUNTER — Encounter: Admitting: Family Medicine

## 2013-12-15 ENCOUNTER — Encounter: Payer: Self-pay | Admitting: Family Medicine
